# Patient Record
Sex: Male | Born: 1955 | Race: White | Hispanic: No | Marital: Married | Smoking: Never smoker
Health system: Southern US, Community
[De-identification: ages and names within clinical notes are randomized; demographics above are authoritative.]

## PROBLEM LIST (undated history)

## (undated) HISTORY — PX: EXCISIONAL HEMORRHOIDECTOMY: SHX1541

---

## 2012-01-16 ENCOUNTER — Ambulatory Visit: Payer: Self-pay | Admitting: Internal Medicine

## 2012-01-16 LAB — DOT URINE DIP: Specific Gravity: 1.015 (ref 1.003–1.030)

## 2014-01-12 ENCOUNTER — Ambulatory Visit: Payer: Self-pay

## 2014-01-12 LAB — DOT URINE DIP: Glucose,UR: NEGATIVE mg/dL (ref 0–75)

## 2015-01-13 ENCOUNTER — Ambulatory Visit: Payer: Self-pay | Admitting: Emergency Medicine

## 2016-01-14 ENCOUNTER — Encounter: Payer: Self-pay | Admitting: *Deleted

## 2016-01-14 ENCOUNTER — Ambulatory Visit
Admission: EM | Admit: 2016-01-14 | Discharge: 2016-01-14 | Disposition: A | Discharge status: Home / Self Care | Payer: Self-pay | Attending: Family Medicine | Admitting: Family Medicine

## 2016-01-14 DIAGNOSIS — Z0289 Encounter for other administrative examinations: Secondary | ICD-10-CM

## 2016-01-14 LAB — DEPT OF TRANSP DIPSTICK, URINE (ARMC ONLY)
Glucose, UA: NEGATIVE mg/dL
PROTEIN: 30 mg/dL — AB
Specific Gravity, Urine: 1.03 (ref 1.005–1.030)

## 2016-01-14 NOTE — ED Provider Notes (Signed)
CSN: [id number]     Arrival date & time 01/14/16  1015 History   First MD Initiated Contact with Patient 01/14/16 1246     Chief Complaint  Patient presents with  . Commercial Driver's License Exam   (Consider location/radiation/quality/duration/timing/severity/associated sxs/prior Treatment) HPI Comments: Patient here for DOT Physical (see scanned form)   The history is provided by the patient.    History reviewed. No pertinent past medical history. Past Surgical History  Procedure Laterality Date  . Excisional hemorrhoidectomy      x2   History reviewed. No pertinent family history. Social History  Substance Use Topics  . Smoking status: Never Smoker   . Smokeless tobacco: Never Used  . Alcohol Use: Yes    Review of Systems  Allergies  Review of patient's allergies indicates no known allergies.  Home Medications   Prior to Admission medications   Not on File   Meds Ordered and Administered this Visit  Medications - No data to display  BP 124/88 mmHg  Pulse 64  Temp(Src) 97.6 F (36.4 C) (Oral)  Resp 18  Ht 6\' 1"  (1.854 m)  Wt 257 lb (116.574 kg)  BMI 33.91 kg/m2  SpO2 98% No data found.   Physical Exam  ED Course  Procedures (including critical care time)  Labs Review Labs Reviewed  DEPT OF TRANSP DIPSTICK, URINE([facility] ONLY)    Imaging Review No results found.   Visual Acuity Review  Right Eye Distance:   Left Eye Distance:   Bilateral Distance:    Right Eye Near:   Left Eye Near:    Bilateral Near:         MDM   1. Encounter for examination required by Department of Transportation (DOT)     DOT Physical (medically qualified for 1 year due to abnormal urine findings; recommended to patient to follow up with his PCP for further evaluation and management; will consider an extension/change to a 2 year certificate based on today date, if further workup by PCP and/or specialist reveals benign condition or resolution;  this was discussed with patient and urged to follow up on this as soon as possible;  see scanned form)  Payton Mccallumrlando Montague Corella, MD 01/14/16 1341

## 2016-01-14 NOTE — ED Notes (Signed)
Patient here for DOT CDL physical.

## 2016-06-07 ENCOUNTER — Ambulatory Visit
Admission: EM | Admit: 2016-06-07 | Discharge: 2016-06-07 | Disposition: A | Discharge status: Home / Self Care | Payer: Self-pay | Attending: Family Medicine | Admitting: Family Medicine

## 2016-06-07 DIAGNOSIS — Z029 Encounter for administrative examinations, unspecified: Secondary | ICD-10-CM

## 2016-06-07 DIAGNOSIS — Z024 Encounter for examination for driving license: Secondary | ICD-10-CM

## 2016-06-07 LAB — DEPT OF TRANSP DIPSTICK, URINE (ARMC ONLY)
Glucose, UA: NEGATIVE mg/dL
PROTEIN: NEGATIVE mg/dL
Specific Gravity, Urine: 1.01 (ref 1.005–1.030)

## 2016-06-07 NOTE — ED Provider Notes (Signed)
CSN: [id number]     Arrival date & time 06/07/16  [id number] History   First MD Initiated Contact with Patient 06/07/16 1003     Chief Complaint  Patient presents with  . Commercial Driver's License Exam   (Consider location/radiation/quality/duration/timing/severity/associated sxs/prior Treatment) Patient presents for DOT physical. He previously came in for his DOT renewal in March 2017. We saw him and certified him for 1 year due to findings of protein and blood in his urinalysis. (See previous note on 01/14/2016). He indicated that he has seen a Insurance underwriterUrologist in March/April 2017 and they did not find any cause for his findings in his urine. The patient did not bring with him any documentation from his Urologist. Otherwise, the he takes Naproxen as needed for left knee pain/arthritis. No other chronic health issues. We discussed that he is still certified until March 2018 so he does not necessarily need another exam before March but the patient wishes to repeat his exam and urinalysis today so that his certification, even if only for a year, will renew after his birthday in May.       History reviewed. No pertinent past medical history. Past Surgical History:  Procedure Laterality Date  . EXCISIONAL HEMORRHOIDECTOMY     x2   History reviewed. No pertinent family history. Social History  Substance Use Topics  . Smoking status: Never Smoker  . Smokeless tobacco: Never Used  . Alcohol use Yes    Review of Systems  Constitutional: Negative.   HENT: Negative.   Eyes: Negative.   Respiratory: Negative.   Cardiovascular: Negative.   Gastrointestinal: Negative.   Endocrine: Negative.   Genitourinary: Positive for hematuria. Negative for discharge, dysuria, flank pain, penile pain and testicular pain.  Musculoskeletal: Positive for arthralgias and joint swelling.  Skin: Negative.   Allergic/Immunologic: Negative.   Neurological: Negative.   Hematological: Negative.   Psychiatric/Behavioral:  Negative.     Allergies  Review of patient's allergies indicates no known allergies.  Home Medications   Prior to Admission medications   Medication Sig Start Date End Date Taking? Authorizing Provider  naproxen (NAPROSYN) 500 MG tablet Take 500 mg by mouth 2 (two) times daily with a meal.   Yes Historical Provider, MD   Meds Ordered and Administered this Visit  Medications - No data to display  BP 121/78 (BP Location: Left Arm)   Pulse 75   Temp 97.8 F (36.6 C) (Tympanic)   Resp 18   Ht 6\' 1"  (1.854 m)   Wt 262 lb (118.8 kg)   SpO2 97%   BMI 34.57 kg/m  No data found.   Physical Exam  Constitutional: He is oriented to person, place, and time. He appears well-developed and well-nourished. No distress.  HENT:  Head: Normocephalic and atraumatic.  Right Ear: Hearing, tympanic membrane, external ear and ear canal normal.  Left Ear: Hearing, tympanic membrane, external ear and ear canal normal.  Nose: Nose normal.  Mouth/Throat: Uvula is midline, oropharynx is clear and moist and mucous membranes are normal.  Eyes: Conjunctivae, EOM and lids are normal. Pupils are equal, round, and reactive to light.  Neck: Trachea normal, normal range of motion, full passive range of motion without pain and phonation normal. Neck supple. No JVD present. Carotid bruit is not present. No thyroid mass and no thyromegaly present.  Cardiovascular: Normal rate, regular rhythm, normal heart sounds and intact distal pulses.   No murmur heard. Pulmonary/Chest: Effort normal and breath sounds normal. He has no wheezes. He has  no rales.  Abdominal: Soft. Bowel sounds are normal. He exhibits no distension and no mass. There is no tenderness. There is no rebound and no guarding. No hernia. Hernia confirmed negative in the right inguinal area and confirmed negative in the left inguinal area.  Musculoskeletal: Normal range of motion.       Left knee: He exhibits swelling. He exhibits normal range of motion,  no ecchymosis, no deformity and no erythema. No tenderness found.  Slight swelling of left medial aspect of patella. Non-tender. Has full range of motion but slight pain with flexion.   Neurological: He is alert and oriented to person, place, and time. He has normal strength and normal reflexes. No cranial nerve deficit or sensory deficit. He displays a negative Romberg sign.    Urgent Care Course   Clinical Course    Procedures (including critical care time)  Labs Review Labs Reviewed  DEPT OF TRANSP DIPSTICK, URINE([facility] ONLY) - Abnormal; Notable for the following:       Result Value   Hgb urine dipstick 1+ (*)    All other components within normal limits    Imaging Review No results found.   Visual Acuity Review  Right Eye Distance: 20/20 Left Eye Distance: 20/20 Bilateral Distance: 20/20  Right Eye Near:   Left Eye Near:    Bilateral Near:         MDM   1. Encounter for Department of Transportation (DOT) examination for driving license renewal   2. Hematuria  Reviewed with patient results of urinalysis and persistence of blood in his urine. Since we do not have the records from the Urologist and blood is still present in his urine, he needs to follow up with his Urologist to confirm benign nature of the hematuria. Patient understands and agrees to make appointment for next week. Recommend patient get documentation from his Urologist that the hematuria is a benign finding for future DOT physicals. At this point, discussed that I will certify him for 1 year from today with documentation on his certificate that he needs to follow-up with a Urologist. Also briefly discussed that NSAIDs can damage his kidneys so that it is important to have further testing and evaluation done. He may need to stop any NSAIDs. Patient expressed verbal understanding.   See scanned DOT physical and paperwork.   Follow-up in 1 year for re-certification.     Sudie Grumbling,  NP 06/08/16 0005

## 2016-06-07 NOTE — Discharge Instructions (Signed)
Please call your Urologist to follow-up regarding blood found in your urine test today. You are certified for 1 year from today for your DOT exam. Please bring any notes or paperwork from your Urologist to your next DOT exam next year.

## 2016-06-07 NOTE — ED Triage Notes (Signed)
DOT Physical

## 2017-05-31 ENCOUNTER — Encounter: Payer: Self-pay | Admitting: *Deleted

## 2017-05-31 ENCOUNTER — Ambulatory Visit
Admission: EM | Admit: 2017-05-31 | Discharge: 2017-05-31 | Disposition: A | Discharge status: Home / Self Care | Payer: Self-pay | Attending: Family Medicine | Admitting: Family Medicine

## 2017-05-31 DIAGNOSIS — R319 Hematuria, unspecified: Secondary | ICD-10-CM

## 2017-05-31 DIAGNOSIS — R809 Proteinuria, unspecified: Secondary | ICD-10-CM

## 2017-05-31 DIAGNOSIS — Z024 Encounter for examination for driving license: Secondary | ICD-10-CM

## 2017-05-31 LAB — DEPT OF TRANSP DIPSTICK, URINE (ARMC ONLY)
Glucose, UA: NEGATIVE mg/dL
Protein, ur: 30 mg/dL — AB
Specific Gravity, Urine: 1.025 (ref 1.005–1.030)

## 2017-05-31 NOTE — ED Provider Notes (Signed)
CSN: [id number]     Arrival date & time 05/31/17  0911 History   First MD Initiated Contact with Patient 05/31/17 1039     Chief Complaint  Patient presents with  . Commercial Driver's License Exam   (Consider location/radiation/quality/duration/timing/severity/associated sxs/prior Treatment) 61 year old male presents for routine DOT physical. Last physical in August 2017. Has history of proteinuria and hematuria. Was seen by a Urologist in spring 2017- had ultrasound/CT and other testing done with no definitive reason for protein and blood in urine. Patient did not bring copies of Urology reports with him. Does take occasional herbal supplements/treatments and has a varying diet. No other chronic health issues. Takes no daily medication.    The history is provided by the patient.    History reviewed. No pertinent past medical history. Past Surgical History:  Procedure Laterality Date  . EXCISIONAL HEMORRHOIDECTOMY     x2   History reviewed. No pertinent family history. Social History  Substance Use Topics  . Smoking status: Never Smoker  . Smokeless tobacco: Never Used  . Alcohol use Yes    Review of Systems  Constitutional: Negative.   HENT: Negative.   Eyes: Negative.   Respiratory: Negative.   Cardiovascular: Negative.   Gastrointestinal: Negative.   Endocrine: Negative.   Genitourinary: Negative.  Hematuria: only microscopic.  Musculoskeletal: Negative.   Skin: Negative.   Allergic/Immunologic: Negative.   Neurological: Negative.   Hematological: Negative.   Psychiatric/Behavioral: Negative.     Allergies  Patient has no known allergies.  Home Medications   Prior to Admission medications   Not on File   Meds Ordered and Administered this Visit  Medications - No data to display  BP 128/85 (BP Location: Left Arm)   Pulse 63   Temp 98.2 F (36.8 C) (Oral)   Resp 16   Ht 6\' 1"  (1.854 m)   Wt 262 lb (118.8 kg)   SpO2 98%   BMI 34.57 kg/m  No data  found.   Physical Exam  Constitutional: He is oriented to person, place, and time. He appears well-developed and well-nourished. No distress.  HENT:  Head: Normocephalic and atraumatic.  Right Ear: Hearing, tympanic membrane, external ear and ear canal normal.  Left Ear: Hearing, tympanic membrane, external ear and ear canal normal.  Nose: Nose normal.  Mouth/Throat: Uvula is midline, oropharynx is clear and moist and mucous membranes are normal.  Eyes: Pupils are equal, round, and reactive to light. Conjunctivae and EOM are normal.  Neck: Normal range of motion. Neck supple.  Cardiovascular: Normal rate, regular rhythm, normal heart sounds and intact distal pulses.   No murmur heard. Pulmonary/Chest: Effort normal and breath sounds normal. No respiratory distress. He has no wheezes. He has no rales.  Abdominal: Soft. Bowel sounds are normal. He exhibits no distension and no mass. There is no hepatosplenomegaly. There is no tenderness. There is no rigidity, no rebound, no guarding and no CVA tenderness. No hernia. Hernia confirmed negative in the right inguinal area and confirmed negative in the left inguinal area.  Musculoskeletal: Normal range of motion.  Lymphadenopathy:    He has no cervical adenopathy.  Neurological: He is alert and oriented to person, place, and time. He has normal strength and normal reflexes. No cranial nerve deficit or sensory deficit. He displays a negative Romberg sign.  Skin: Skin is warm and dry. Capillary refill takes less than 2 seconds.  Psychiatric: He has a normal mood and affect. His behavior is normal. Judgment and  thought content normal.    Urgent Care Course     Procedures (including critical care time)  Labs Review Labs Reviewed  DEPT OF TRANSP DIPSTICK, URINE([facility] ONLY) - Abnormal; Notable for the following:       Result Value   Protein, ur 30 (*)    Hgb urine dipstick TRACE (*)    All other components within normal limits     Imaging Review No results found.   Visual Acuity Review  Right Eye Distance:   Left Eye Distance:   Bilateral Distance:    Right Eye Near:   Left Eye Near:    Bilateral Near:         MDM   1. Encounter for commercial driver medical examination (CDME)   2. Hematuria, unspecified type   3. Proteinuria, unspecified type    Reviewed urinalysis results with patient that showed continued proteinuria and hematuria. Since previous work-up by Urologist was negative, will certify him for 1 year. Recommend patient return to Urologist by the end of 2018 for recheck and possible additional labwork/testing. Discussed importance of bringing Urology paperwork with him to next DOT exam. Forms completed today. Follow-up in August 2019 for next DOT exam.     Sudie GrumblingAmyot, Ann Berry, NP 05/31/17 2259

## 2017-05-31 NOTE — ED Triage Notes (Signed)
DOT physical. 

## 2017-05-31 NOTE — Discharge Instructions (Signed)
Due to continued protein and blood in your urine, you have been certified for 1 year so we can continue to monitor this condition. Recommend follow-up with your Urologist this year for further evaluation. Bring copy of paperwork from Urologist to next DOT exam in August 2019.

## 2018-04-26 ENCOUNTER — Ambulatory Visit
Admission: RE | Admit: 2018-04-26 | Discharge: 2018-04-26 | Disposition: A | Discharge status: Home / Self Care | Payer: Worker's Compensation | Source: Ambulatory Visit | Attending: Physician Assistant | Admitting: Physician Assistant

## 2018-04-26 ENCOUNTER — Other Ambulatory Visit: Payer: Self-pay | Admitting: Physician Assistant

## 2018-04-26 DIAGNOSIS — M25572 Pain in left ankle and joints of left foot: Secondary | ICD-10-CM

## 2018-11-14 IMAGING — CR DG FOOT COMPLETE 3+V*L*
3 series · 3 of 3 positions shown · non-contrast
Comparison: None

CLINICAL DATA: Acute LEFT foot pain following injury 3 days ago.
Initial encounter.

EXAM:
LEFT FOOT - COMPLETE 3+ VIEW

[foot ap]
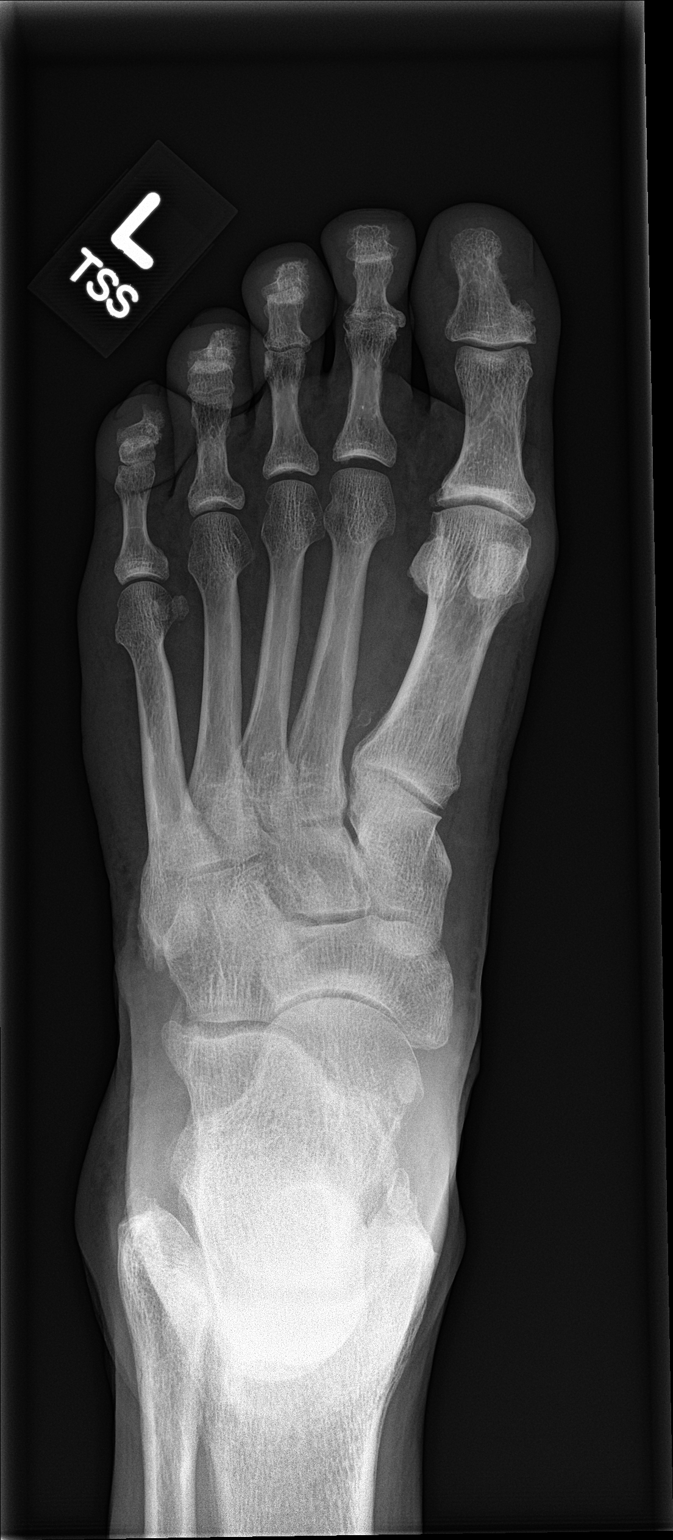

[foot obl]
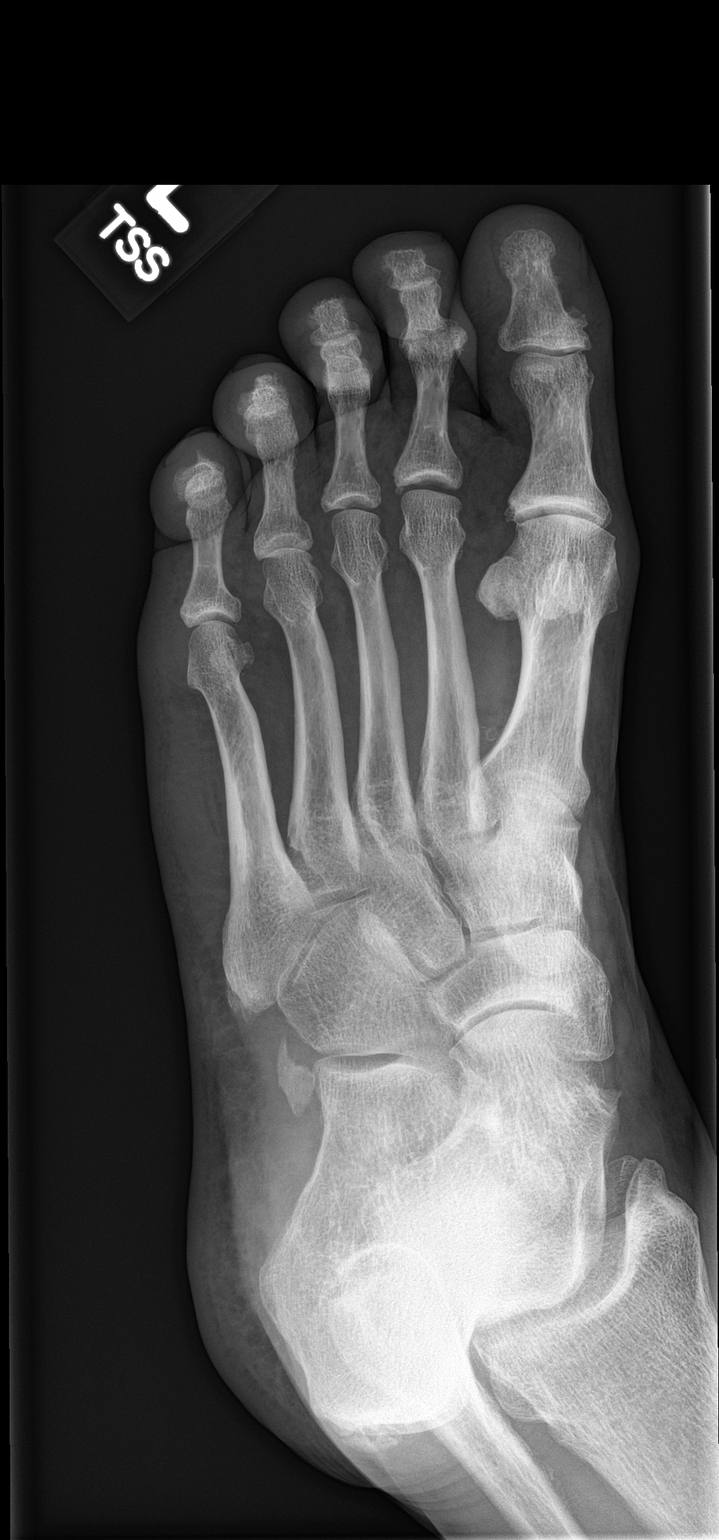

[foot lat]
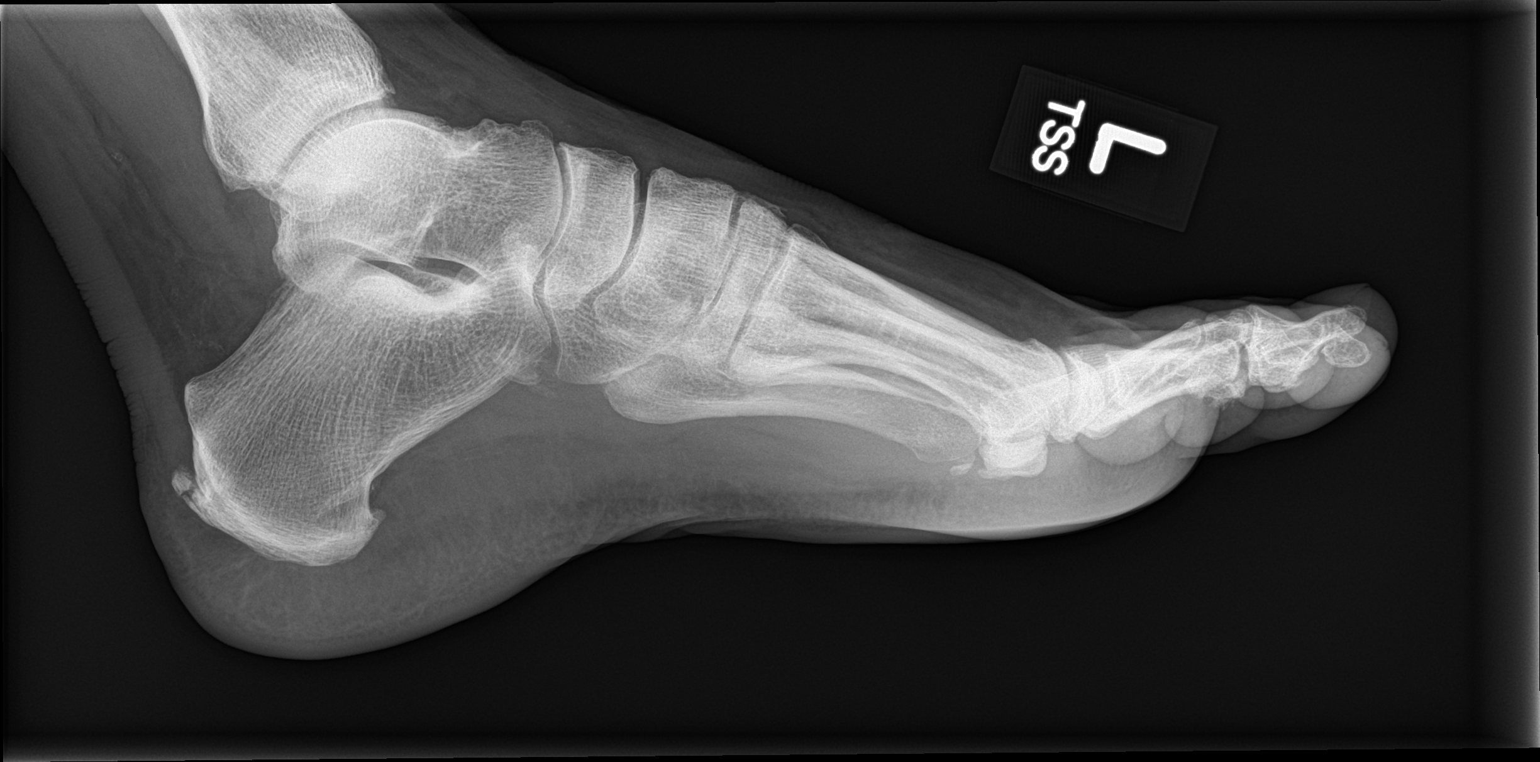

[3 of 3 positions shown; findings below may reference images not displayed]

FINDINGS: No acute fracture, subluxation or dislocation.

The Lisfranc joints are unremarkable.

No suspicious focal bony lesions are identified.
IMPRESSION: No acute bony abnormality

## 2019-09-21 ENCOUNTER — Emergency Department
Admission: EM | Admit: 2019-09-21 | Discharge: 2019-09-21 | Disposition: A | Discharge status: Home / Self Care | Payer: Self-pay | Attending: Emergency Medicine | Admitting: Emergency Medicine

## 2019-09-21 ENCOUNTER — Other Ambulatory Visit: Payer: Self-pay

## 2019-09-21 ENCOUNTER — Encounter: Payer: Self-pay | Admitting: Emergency Medicine

## 2019-09-21 DIAGNOSIS — Z5321 Procedure and treatment not carried out due to patient leaving prior to being seen by health care provider: Secondary | ICD-10-CM | POA: Insufficient documentation

## 2019-09-21 DIAGNOSIS — K0889 Other specified disorders of teeth and supporting structures: Secondary | ICD-10-CM | POA: Insufficient documentation

## 2019-09-21 NOTE — ED Triage Notes (Signed)
Patient with compliant of dental pain that started about 08:00 Saturday morning. Patient states that it is a left lower tooth.
